# Patient Record
Sex: Female | Born: 1995 | Race: Black or African American | Hispanic: No | Marital: Single | Smoking: Former smoker
Health system: Southern US, Community
[De-identification: ages and names within clinical notes are randomized; demographics above are authoritative.]

## PROBLEM LIST (undated history)

## (undated) DIAGNOSIS — F419 Anxiety disorder, unspecified: Secondary | ICD-10-CM

## (undated) DIAGNOSIS — F32A Depression, unspecified: Secondary | ICD-10-CM

## (undated) DIAGNOSIS — F319 Bipolar disorder, unspecified: Secondary | ICD-10-CM

## (undated) DIAGNOSIS — F329 Major depressive disorder, single episode, unspecified: Secondary | ICD-10-CM

---

## 2016-10-10 ENCOUNTER — Emergency Department (HOSPITAL_COMMUNITY): Payer: No Typology Code available for payment source

## 2016-10-10 ENCOUNTER — Emergency Department (HOSPITAL_COMMUNITY)
Admission: EM | Admit: 2016-10-10 | Discharge: 2016-10-11 | Disposition: A | Discharge status: Home / Self Care | Payer: No Typology Code available for payment source | Attending: Emergency Medicine | Admitting: Emergency Medicine

## 2016-10-10 ENCOUNTER — Encounter (HOSPITAL_COMMUNITY): Payer: Self-pay | Admitting: Oncology

## 2016-10-10 DIAGNOSIS — Y92410 Unspecified street and highway as the place of occurrence of the external cause: Secondary | ICD-10-CM | POA: Diagnosis not present

## 2016-10-10 DIAGNOSIS — Z87891 Personal history of nicotine dependence: Secondary | ICD-10-CM | POA: Diagnosis not present

## 2016-10-10 DIAGNOSIS — Y999 Unspecified external cause status: Secondary | ICD-10-CM | POA: Insufficient documentation

## 2016-10-10 DIAGNOSIS — S0992XA Unspecified injury of nose, initial encounter: Secondary | ICD-10-CM | POA: Diagnosis present

## 2016-10-10 DIAGNOSIS — S0121XA Laceration without foreign body of nose, initial encounter: Secondary | ICD-10-CM | POA: Insufficient documentation

## 2016-10-10 DIAGNOSIS — Y939 Activity, unspecified: Secondary | ICD-10-CM | POA: Insufficient documentation

## 2016-10-10 HISTORY — DX: Depression, unspecified: F32.A

## 2016-10-10 HISTORY — DX: Bipolar disorder, unspecified: F31.9

## 2016-10-10 HISTORY — DX: Anxiety disorder, unspecified: F41.9

## 2016-10-10 HISTORY — DX: Major depressive disorder, single episode, unspecified: F32.9

## 2016-10-10 MED ORDER — ACETAMINOPHEN 500 MG PO TABS
1000.0000 mg | ORAL_TABLET | Freq: Once | ORAL | Status: AC
  Administered 2016-10-11: 1000 mg via ORAL
  Filled 2016-10-10: qty 2
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
## 2016-10-10 NOTE — ED Notes (Signed)
Patient transported to X-ray 
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
## 2016-10-11 MED ORDER — SULFAMETHOXAZOLE-TRIMETHOPRIM 800-160 MG PO TABS: 1.0000 | ORAL_TABLET | Freq: Two times a day (BID) | ORAL | 0 refills | Status: AC

## 2016-10-11 MED ORDER — LORAZEPAM 1 MG PO TABS
1.0000 mg | ORAL_TABLET | Freq: Once | ORAL | Status: AC
  Administered 2016-10-11: 1 mg via ORAL
  Filled 2016-10-11: qty 1

## 2016-10-11 MED ORDER — HYDROCODONE-ACETAMINOPHEN 5-325 MG PO TABS
1.0000 | ORAL_TABLET | Freq: Once | ORAL | Status: AC
  Administered 2016-10-11: 1 via ORAL
  Filled 2016-10-11: qty 1

## 2016-10-11 MED ORDER — CYCLOBENZAPRINE HCL 10 MG PO TABS: 10.0000 mg | ORAL_TABLET | Freq: Three times a day (TID) | ORAL | 0 refills | Status: AC | PRN

## 2016-10-11 NOTE — ED Notes (Addendum)
Pt asking for evaluation of b/l legs d/t warmth and pain.

## 2016-10-11 NOTE — ED Notes (Signed)
Pt up to the br steady gait wants to put her pants back on
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
